# Patient Record
Sex: Male | Born: 1935 | Race: Black or African American | Hispanic: No | Marital: Married | Smoking: Former smoker
Health system: Southern US, Community
[De-identification: ages and names within clinical notes are randomized; demographics above are authoritative.]

## PROBLEM LIST (undated history)

## (undated) DIAGNOSIS — J449 Chronic obstructive pulmonary disease, unspecified: Secondary | ICD-10-CM

## (undated) DIAGNOSIS — K219 Gastro-esophageal reflux disease without esophagitis: Secondary | ICD-10-CM

## (undated) DIAGNOSIS — I10 Essential (primary) hypertension: Secondary | ICD-10-CM

## (undated) DIAGNOSIS — E049 Nontoxic goiter, unspecified: Secondary | ICD-10-CM

## (undated) DIAGNOSIS — N400 Enlarged prostate without lower urinary tract symptoms: Secondary | ICD-10-CM

## (undated) DIAGNOSIS — G4730 Sleep apnea, unspecified: Secondary | ICD-10-CM

## (undated) DIAGNOSIS — H409 Unspecified glaucoma: Secondary | ICD-10-CM

## (undated) HISTORY — PX: THYROIDECTOMY: SHX17

## (undated) HISTORY — DX: Benign prostatic hyperplasia without lower urinary tract symptoms: N40.0

## (undated) HISTORY — DX: Sleep apnea, unspecified: G47.30

## (undated) HISTORY — PX: TONSILLECTOMY: SUR1361

## (undated) HISTORY — PX: KNEE SURGERY: SHX244

## (undated) HISTORY — DX: Gastro-esophageal reflux disease without esophagitis: K21.9

## (undated) HISTORY — PX: FOOT SURGERY: SHX648

## (undated) HISTORY — PX: HERNIA REPAIR: SHX51

## (undated) HISTORY — DX: Essential (primary) hypertension: I10

## (undated) HISTORY — DX: Unspecified glaucoma: H40.9

## (undated) HISTORY — DX: Nontoxic goiter, unspecified: E04.9

## (undated) HISTORY — DX: Chronic obstructive pulmonary disease, unspecified: J44.9

## (undated) HISTORY — PX: PROSTATE SURGERY: SHX751

---

## 2004-06-28 ENCOUNTER — Ambulatory Visit: Payer: Self-pay | Admitting: Cardiology

## 2004-12-18 ENCOUNTER — Ambulatory Visit: Payer: Self-pay | Admitting: Cardiology

## 2004-12-20 ENCOUNTER — Ambulatory Visit: Payer: Self-pay | Admitting: Cardiology

## 2004-12-26 ENCOUNTER — Ambulatory Visit: Payer: Self-pay | Admitting: Physician Assistant

## 2004-12-31 ENCOUNTER — Inpatient Hospital Stay (HOSPITAL_BASED_OUTPATIENT_CLINIC_OR_DEPARTMENT_OTHER): Admission: RE | Admit: 2004-12-31 | Discharge: 2004-12-31 | Payer: Self-pay | Admitting: Internal Medicine

## 2004-12-31 ENCOUNTER — Ambulatory Visit: Payer: Self-pay | Admitting: Internal Medicine

## 2005-01-15 ENCOUNTER — Ambulatory Visit: Payer: Self-pay | Admitting: Cardiology

## 2005-01-18 ENCOUNTER — Ambulatory Visit: Payer: Self-pay | Admitting: *Deleted

## 2005-01-29 ENCOUNTER — Ambulatory Visit: Payer: Self-pay | Admitting: Cardiology

## 2005-03-05 ENCOUNTER — Ambulatory Visit: Payer: Self-pay | Admitting: Cardiology

## 2005-03-18 ENCOUNTER — Ambulatory Visit: Payer: Self-pay | Admitting: Cardiology

## 2005-05-15 ENCOUNTER — Ambulatory Visit: Payer: Self-pay | Admitting: Cardiology

## 2005-09-30 ENCOUNTER — Ambulatory Visit: Payer: Self-pay | Admitting: Cardiology

## 2010-01-12 ENCOUNTER — Ambulatory Visit: Payer: Self-pay | Admitting: Cardiology

## 2010-11-23 NOTE — Cardiovascular Report (Signed)
Trevor Burns, Trevor Burns                ACCOUNT NO.:  [id number]   MEDICAL RECORD NO.:  [id number]          PATIENT TYPE:  OIB   LOCATION:  6501                         FACILITY:  [facility]   PHYSICIAN:  Arvilla Meres, M.D. LHCDATE OF BIRTH:  [birth year]   DATE OF PROCEDURE:  12/31/2004  DATE OF DISCHARGE:                              CARDIAC CATHETERIZATION   PRIMARY CARDIOLOGIST:  Learta Codding, M.D.   PROCEDURES PERFORMED:  1.  Selective coronary angiography.  2.  Left heart catheterization.  3.  Left ventriculogram.  4.  Aortic root angiography.  5.  Abdominal aortogram.   DESCRIPTION OF PROCEDURE:  The risks and benefits of the catheterization  were explained to Trevor Burns.  Consent was signed and placed on the chart.  A 4 French arterial sheath was placed in the right femoral artery using a  modified Seldinger technique.  Standard JL5, JR4 and angled pigtail were  used for the procedure.  There were no apparent complications.  At the end  of the procedure, the patient was transferred to the holding area in stable  condition for removal of his arterial access.   HEMODYNAMICS:  The aortic pressure was 127/86 with a mean of 101.  However,  there was a disconnect as his cuff pressure showed 170/100.  LV pressure was  130/4 with an LVEDP of 11.  There was no significant gradient on aortic  valve pullback.   CORONARY ANATOMY:  The left main was normal.   The LAD was a large vessel wrapping the apex.  It gave off three diagonals.  The first diagonal was large, the second diagonal was tiny and the third  diagonal was medium size.  There was a 40% lesion in the mid LAD at the  takeoff of the second diagonal.  The artery was otherwise free of  significant coronary disease.   The left circumflex was a large codominant vessel.  It gave off two large  OMs and two small PLs.  There as no angiographic coronary disease.   The right coronary artery was a moderate, codominant vessel.  It  ended with  a PDA.  It gave off a large right ventricular branch which also served acute  marginal territory.  There was no angiographic CAD.   Left ventriculogram done in the RAO approach showed moderate LV dysfunction  with an EF estimated about 35-40%.  There appeared to be a global  hypokinesis.   Abdominal aortogram showed that the distal aorta and iliac system were free  of any significant atherosclerotic plaquing.   The aortic root shot showed the ascending aorta to be mildly dilated.  It  did not fill the cusps well, however, there did not appear to be a  significant aortic insufficiency.  The thoracic aorta was unfolded.  There  was no significant atherosclerotic plaquing.   ASSESSMENT:  1.  Minimal nonobstructive coronary disease with 40% mid lesion.  Otherwise      no significant coronary disease.  2.  Moderate left ventricular dysfunction consistent with nonischemic      cardiomyopathy.  Filling pressure appear to  be okay.  3.  Mildly dilated aortic root without obvious aortic insufficiency.  4.  Patent renal arteries with no significant aortoiliac plaquing.  5.  Disconnect between cuff pressures and central aortic pressures.   PLAN:  We will continue medical therapy for his nonischemic cardiomyopathy.  Would consider an echocardiogram to accurately measure the width of his  aortic root and to make sure that there is no significant valvular disease.       DB/MEDQ  D:  12/31/2004  T:  12/31/2004  Job:  [id number]

## 2012-03-06 ENCOUNTER — Encounter: Payer: Self-pay | Admitting: Emergency Medicine

## 2012-03-08 ENCOUNTER — Encounter: Payer: Self-pay | Admitting: Physician Assistant

## 2012-03-08 DIAGNOSIS — J40 Bronchitis, not specified as acute or chronic: Secondary | ICD-10-CM

## 2012-03-08 DIAGNOSIS — R079 Chest pain, unspecified: Secondary | ICD-10-CM

## 2012-03-08 DIAGNOSIS — I4891 Unspecified atrial fibrillation: Secondary | ICD-10-CM

## 2012-03-10 ENCOUNTER — Encounter: Payer: Self-pay | Admitting: Cardiology

## 2012-03-10 ENCOUNTER — Encounter: Payer: Self-pay | Admitting: Internal Medicine

## 2012-03-10 ENCOUNTER — Encounter: Payer: Self-pay | Admitting: Physician Assistant

## 2012-03-10 DIAGNOSIS — R072 Precordial pain: Secondary | ICD-10-CM

## 2012-03-19 ENCOUNTER — Encounter: Payer: Self-pay | Admitting: Cardiology

## 2012-03-20 ENCOUNTER — Ambulatory Visit (INDEPENDENT_AMBULATORY_CARE_PROVIDER_SITE_OTHER): Payer: Medicare Other | Admitting: Physician Assistant

## 2012-03-20 ENCOUNTER — Encounter: Payer: Self-pay | Admitting: *Deleted

## 2012-03-20 ENCOUNTER — Other Ambulatory Visit: Payer: Self-pay | Admitting: Physician Assistant

## 2012-03-20 ENCOUNTER — Encounter: Payer: Self-pay | Admitting: Physician Assistant

## 2012-03-20 VITALS — BP 100/60 | HR 79 | Ht 74.0 in | Wt 178.1 lb

## 2012-03-20 DIAGNOSIS — I4891 Unspecified atrial fibrillation: Secondary | ICD-10-CM

## 2012-03-20 DIAGNOSIS — R609 Edema, unspecified: Secondary | ICD-10-CM

## 2012-03-20 DIAGNOSIS — I499 Cardiac arrhythmia, unspecified: Secondary | ICD-10-CM

## 2012-03-20 DIAGNOSIS — N289 Disorder of kidney and ureter, unspecified: Secondary | ICD-10-CM

## 2012-03-20 DIAGNOSIS — I10 Essential (primary) hypertension: Secondary | ICD-10-CM

## 2012-03-20 DIAGNOSIS — R0989 Other specified symptoms and signs involving the circulatory and respiratory systems: Secondary | ICD-10-CM

## 2012-03-20 NOTE — Progress Notes (Signed)
Primary Cardiologist:  HPI: Post hospital followup from [facility], status post cardiology consultation on September 1, by cardiology fellow, for evaluation of CP and new onset AF.  Patient ruled out for MI with normal cardiac markers. TSH normal. Of note, however, there was no documented atrial fibrillation, during his brief stay. Nevertheless, he was assessed with a CHADS2 score of 2.  Also, clinically patient denies any history of palpitations. He states that the reason he was sent to [facility] was per Dr. Ruthe Mannan request. He presented to his office with complaint of SOB, but denied any chest pain.  Since discharge, he continues to report no angina pectoris or palpitations. In fact, he denies any prior history of exertional CP. He has no known CAD, with history of prior negative stress test. When he does complain of, however, he has worsening DOE. And he denies any associated orthopnea or PND. He does have COPD, for which he is on nebulizer therapy and uses oxygen as needed. He states he quit smoking over 40 years ago.  EKG office today, reviewed by me, indicated NSR at 79 bpm, with no ischemic changes.  Allergies  Allergen Reactions  . Oxycodone     Current Outpatient Prescriptions  Medication Sig Dispense Refill  . albuterol (PROVENTIL HFA;VENTOLIN HFA) 108 (90 BASE) MCG/ACT inhaler Inhale 1 puff into the lungs every 6 (six) hours as needed.      Marland Kitchen albuterol (PROVENTIL) (2.5 MG/3ML) 0.083% nebulizer solution Take 2.5 mg by nebulization every 6 (six) hours as needed.      Marland Kitchen albuterol-ipratropium (COMBIVENT) 18-103 MCG/ACT inhaler Inhale 1 puff into the lungs every 6 (six) hours as needed.       Marland Kitchen arformoterol (BROVANA) 15 MCG/2ML NEBU Take 15 mcg by nebulization 2 (two) times daily.      Marland Kitchen aspirin 81 MG tablet Take 81 mg by mouth daily.      . Azilsartan Medoxomil 80 MG TABS Take 80 mg by mouth daily.      . bimatoprost (LUMIGAN) 0.03 % ophthalmic solution 1 drop at bedtime.      . brimonidine  (ALPHAGAN) 0.15 % ophthalmic solution 1 drop 3 (three) times daily.      . budesonide (PULMICORT) 180 MCG/ACT inhaler Inhale 1 puff into the lungs 2 (two) times daily.      Marland Kitchen CALCIUM PO Take 500 mg by mouth daily.      . cetirizine (ZYRTEC) 10 MG tablet Take 10 mg by mouth daily.      . cholecalciferol (VITAMIN D) 1000 UNITS tablet Take 1,000 Units by mouth daily.      Marland Kitchen doxazosin (CARDURA) 2 MG tablet Take 2 mg by mouth at bedtime.      Marland Kitchen EPINEPHrine (EPIPEN JR) 0.15 MG/0.3ML injection Inject 0.15 mg into the muscle as needed.      Marland Kitchen esomeprazole (NEXIUM) 20 MG capsule Take 20 mg by mouth daily before breakfast.      . furosemide (LASIX) 20 MG tablet Take 20 mg by mouth daily.      Marland Kitchen guaiFENesin (MUCINEX) 600 MG 12 hr tablet Take 1,200 mg by mouth 2 (two) times daily.      Marland Kitchen levothyroxine (SYNTHROID, LEVOTHROID) 125 MCG tablet Take 125 mcg by mouth daily.      Marland Kitchen POTASSIUM CHLORIDE PO Take 500 mg by mouth daily.      Marland Kitchen pyridoxine (B-6) 250 MG tablet Take 250 mg by mouth daily.      . theophylline (THEO-24) 100 MG 24 hr  capsule Take 100 mg by mouth daily.        Past Medical History  Diagnosis Date  . Hypertension   . Acid reflux   . Glaucoma   . Sleep apnea   . Prostatic hypertrophy   . GERD (gastroesophageal reflux disease)   . Chronic obstructive pulmonary disease   . Thyroid enlargement     Had a throidectomy    Past Surgical History  Procedure Date  . Foot surgery   . Knee surgery   . Hernia repair   . Tonsillectomy   . Prostate surgery   . Thyroidectomy     History   Social History  . Marital Status: Married    Spouse Name: N/A    Number of Children: N/A  . Years of Education: N/A   Occupational History  . Not on file.   Social History Main Topics  . Smoking status: Former Smoker -- 0.3 packs/day for 60 years    Types: Cigarettes    Quit date: 07/09/2007  . Smokeless tobacco: Not on file  . Alcohol Use: No  . Drug Use: No  . Sexually Active: Not on file     Other Topics Concern  . Not on file   Social History Narrative  . No narrative on file    Family History  Problem Relation Age of Onset  . Heart disease Mother   . Alcohol abuse Father   . Cancer Father     Cause of death; he also had a colostomy bag    ROS: no nausea, vomiting; no fever, chills; no melena, hematochezia; no claudication  PHYSICAL EXAM: BP 100/60  Pulse 79  Ht 6\' 2"  (1.88 m)  Wt 178 lb 1.9 oz (80.795 kg)  BMI 22.87 kg/m2 GENERAL: 78 year-old male; NAD HEENT: NCAT, PERRLA, EOMI; sclera clear; no xanthelasma NECK: palpable bilateral carotid pulses, no bruits; no JVD; no TM LUNGS: Faint expiratory wheezes, no crackles CARDIAC: RRR (S1, S2); no significant murmurs; no rubs or gallops ABDOMEN: soft, non-tender; intact BS EXTREMETIES: 2+ bilateral nonpitting peripheral edema SKIN: warm/dry; no obvious rash/lesions MUSCULOSKELETAL: no joint deformity NEURO: no focal deficit; NL affect   EKG: reviewed and available in Electronic Records   ASSESSMENT & PLAN:  Dyspnea on exertion I am concerned that this may represent an anginal equivalent. Therefore, will schedule a Lexiscan stress Myoview for risk stratification, to rule out ischemia. If this is abnormal, then we'll need to strongly consider proceeding with coronary angiography to rule out significant CAD. Will schedule early return followup with myself in 2 weeks, for review of test results and further recommendations. Of note, the patient's wife states that he had previously followed with Dr. Diona Browner, here in our [place] clinic.  Edema Recent echocardiogram indicated normal RV function. Suspect that this is secondary to chronic venous insufficiency.  Dysrhythmia Although recent cardiology consultation was regarding new onset atrial fibrillation, there was no definite documentation of such. Moreover, patient reports no history of palpitations, and is in NSR at present time. Therefore, no further workup  currently indicated.  Renal insufficiency Patient had a peak creatinine of 1.7 during recent stay, improved to 1.1 at time of discharge. Would need to closely monitor this, in the event he were to proceed with a cardiac catheterization.    Gene Roneka Gilpin, PAC

## 2012-03-20 NOTE — Patient Instructions (Addendum)
Your physician recommends that you schedule a follow-up appointment in: 2 weeks.  Your physician recommends that you continue on your current medications as directed. Please refer to the Current Medication list given to you today.  Your physician has requested that you have a lexiscan myoview. For further information please visit [email or web address] Please follow instruction sheet, as given.

## 2012-03-20 NOTE — Assessment & Plan Note (Signed)
Recent echocardiogram indicated normal RV function. Suspect that this is secondary to chronic venous insufficiency.

## 2012-03-20 NOTE — Assessment & Plan Note (Signed)
Patient had a peak creatinine of 1.7 during recent stay, improved to 1.1 at time of discharge. Would need to closely monitor this, in the event he were to proceed with a cardiac catheterization.

## 2012-03-20 NOTE — Assessment & Plan Note (Signed)
Although recent cardiology consultation was regarding new onset atrial fibrillation, there was no definite documentation of such. Moreover, patient reports no history of palpitations, and is in NSR at present time. Therefore, no further workup currently indicated.

## 2012-03-20 NOTE — Assessment & Plan Note (Signed)
I am concerned that this may represent an anginal equivalent. Therefore, will schedule a Lexiscan stress Myoview for risk stratification, to rule out ischemia. If this is abnormal, then we'll need to strongly consider proceeding with coronary angiography to rule out significant CAD. Will schedule early return followup with myself in 2 weeks, for review of test results and further recommendations. Of note, the patient's wife states that he had previously followed with Dr. Diona Browner, here in our [place] clinic.

## 2012-03-25 DIAGNOSIS — I4891 Unspecified atrial fibrillation: Secondary | ICD-10-CM

## 2012-03-27 ENCOUNTER — Telehealth: Payer: Self-pay | Admitting: *Deleted

## 2012-03-27 NOTE — Telephone Encounter (Signed)
Patient informed. 

## 2012-03-27 NOTE — Telephone Encounter (Signed)
Message copied by Eustace Moore on Fri Mar 27, 2012  4:15 PM ------      Message from: Rande Brunt      Created: Thu Mar 26, 2012  3:50 PM       Abnormal stress test suggestive of ischemia. Need to discuss possible cardiac catheterization, at early office followup.

## 2012-04-01 ENCOUNTER — Ambulatory Visit: Payer: Medicare Other | Admitting: Physician Assistant

## 2012-04-02 ENCOUNTER — Ambulatory Visit: Payer: Medicare Other | Admitting: Physician Assistant

## 2012-04-03 ENCOUNTER — Ambulatory Visit (INDEPENDENT_AMBULATORY_CARE_PROVIDER_SITE_OTHER): Payer: Medicare Other | Admitting: Physician Assistant

## 2012-04-03 ENCOUNTER — Encounter: Payer: Self-pay | Admitting: *Deleted

## 2012-04-03 ENCOUNTER — Encounter: Payer: Self-pay | Admitting: Physician Assistant

## 2012-04-03 VITALS — BP 135/82 | HR 50 | Ht 74.0 in | Wt 178.0 lb

## 2012-04-03 DIAGNOSIS — I10 Essential (primary) hypertension: Secondary | ICD-10-CM

## 2012-04-03 DIAGNOSIS — R9430 Abnormal result of cardiovascular function study, unspecified: Secondary | ICD-10-CM | POA: Insufficient documentation

## 2012-04-03 DIAGNOSIS — Z01810 Encounter for preprocedural cardiovascular examination: Secondary | ICD-10-CM

## 2012-04-03 DIAGNOSIS — N289 Disorder of kidney and ureter, unspecified: Secondary | ICD-10-CM

## 2012-04-03 DIAGNOSIS — R072 Precordial pain: Secondary | ICD-10-CM

## 2012-04-03 DIAGNOSIS — R0602 Shortness of breath: Secondary | ICD-10-CM

## 2012-04-03 DIAGNOSIS — R609 Edema, unspecified: Secondary | ICD-10-CM

## 2012-04-03 LAB — PROTIME-INR

## 2012-04-03 NOTE — Assessment & Plan Note (Signed)
Stable on current medication regimen 

## 2012-04-03 NOTE — Assessment & Plan Note (Addendum)
Cardiac catheterization to be performed with limited dye load, and without LVG. Will check followup labs. Patient had a peak creatinine of 1.7 on September 1, with subsequent normalization of renal function on September 4. Lasix to be discontinued, in preparation for the procedure.

## 2012-04-03 NOTE — Assessment & Plan Note (Signed)
Recent echocardiogram indicated normal RV function. Suspect that this is secondary to chronic venous insufficiency. 

## 2012-04-03 NOTE — Assessment & Plan Note (Signed)
Recommendation is to proceed with an elective diagnostic cardiac catheterization to exclude significant CAD. I am concerned that the patient's complaint of DOE represents an anginal equivalent. Results of the recent abnormal Myoview study were reviewed with the patient and his wife, and they both agreed to proceed. The risks/benefits of a cardiac catheterization were  reviewed. The plan was also reviewed with, and agreed by, Dr. Myrtis Ser. Of note, we will arrange for this to be a right/left heart procedure, in light of his diagnosis of COPD and his requirement for continuous oxygen. We will also limit contrast dye, and deferred LVG secondary to recent history of renal sufficiency. Moreover, a recent 2D echocardiogram yielded normal LVF.

## 2012-04-03 NOTE — Patient Instructions (Addendum)
   Left & right heart catherization - see info sheet given   Stop Lasix   Stop Potassium  Continue all other current medications. Follow up given after above procedure

## 2012-04-03 NOTE — Progress Notes (Signed)
Primary Cardiologist: Simona Huh, MD   HPI: Patient presents for review of recent abnormal stress test.  When last seen here in clinic for post hospital followup on September 13, I was concerned that his DOE represented an anginal equivalent. He presented with no known history of CAD, and had recently ruled out for MI with negative cardiac markers, here at [facility]. He also had an echocardiogram which yielded normal LVF (EF 55-60%), with diastolic dysfunction, normal RVF, and no significant valvular abnormalities.   We proceeded with a Lexiscan Cardiolite on September 18, reviewed by Dr. Myrtis Ser, which suggested anteroseptal/anterior wall ischemia.  Since his last OV, patient reports no exacerbation of his baseline symptoms of DOE. However, he now suggests some lower neck tightness associated with walking, relieved by rest. He continues to deny any frank CP.    Allergies  Allergen Reactions  . Oxycodone     Current Outpatient Prescriptions  Medication Sig Dispense Refill  . albuterol (PROVENTIL HFA;VENTOLIN HFA) 108 (90 BASE) MCG/ACT inhaler Inhale 1 puff into the lungs every 6 (six) hours as needed.      Marland Kitchen albuterol (PROVENTIL) (2.5 MG/3ML) 0.083% nebulizer solution Take 2.5 mg by nebulization every 6 (six) hours as needed.      Marland Kitchen albuterol-ipratropium (COMBIVENT) 18-103 MCG/ACT inhaler Inhale 1 puff into the lungs every 6 (six) hours as needed.       Marland Kitchen arformoterol (BROVANA) 15 MCG/2ML NEBU Take 15 mcg by nebulization 2 (two) times daily.      Marland Kitchen aspirin 81 MG tablet Take 81 mg by mouth daily.      . Azilsartan Medoxomil 80 MG TABS Take 80 mg by mouth daily.      . bimatoprost (LUMIGAN) 0.03 % ophthalmic solution 1 drop at bedtime.      . brimonidine (ALPHAGAN) 0.15 % ophthalmic solution 1 drop 2 (two) times daily.       . budesonide (PULMICORT) 180 MCG/ACT inhaler Inhale 1 puff into the lungs 2 (two) times daily.      Marland Kitchen CALCIUM PO Take 500 mg by mouth daily.      . cetirizine (ZYRTEC) 10 MG  tablet Take 10 mg by mouth daily.      . cholecalciferol (VITAMIN D) 1000 UNITS tablet Take 1,000 Units by mouth daily.      Marland Kitchen doxazosin (CARDURA) 2 MG tablet Take 2 mg by mouth at bedtime.      . DULERA 200-5 MCG/ACT AERO Inhale 2 puffs into the lungs 2 (two) times daily.       Marland Kitchen EPINEPHrine (EPIPEN JR) 0.15 MG/0.3ML injection Inject 0.15 mg into the muscle as needed.      Marland Kitchen esomeprazole (NEXIUM) 20 MG capsule Take 20 mg by mouth daily before breakfast.      . furosemide (LASIX) 20 MG tablet Take 20 mg by mouth daily.      Marland Kitchen guaiFENesin (MUCINEX) 600 MG 12 hr tablet Take 1,200 mg by mouth as needed.       Marland Kitchen levothyroxine (SYNTHROID, LEVOTHROID) 125 MCG tablet Take 125 mcg by mouth daily.      Marland Kitchen POTASSIUM CHLORIDE PO Take 500 mg by mouth daily.      Marland Kitchen pyridoxine (B-6) 250 MG tablet Take 250 mg by mouth daily.      . theophylline (THEO-24) 100 MG 24 hr capsule Take 100 mg by mouth daily.        Past Medical History  Diagnosis Date  . Hypertension   . Acid reflux   .  Glaucoma   . Sleep apnea   . Prostatic hypertrophy   . GERD (gastroesophageal reflux disease)   . Chronic obstructive pulmonary disease   . Thyroid enlargement     Had a throidectomy    Past Surgical History  Procedure Date  . Foot surgery   . Knee surgery   . Hernia repair   . Tonsillectomy   . Prostate surgery   . Thyroidectomy     History   Social History  . Marital Status: Married    Spouse Name: N/A    Number of Children: N/A  . Years of Education: N/A   Occupational History  . Not on file.   Social History Main Topics  . Smoking status: Former Smoker -- 0.3 packs/day for 60 years    Types: Cigarettes    Quit date: 07/09/2007  . Smokeless tobacco: Not on file  . Alcohol Use: No  . Drug Use: No  . Sexually Active: Not on file   Other Topics Concern  . Not on file   Social History Narrative  . No narrative on file   Social History Narrative  . No narrative on file    Problem Relation Age  of Onset  . Heart disease Mother   . Alcohol abuse Father   . Cancer Father     Cause of death; he also had a colostomy bag    ROS: no nausea, vomiting; no fever, chills; no melena, hematochezia; no claudication  PHYSICAL EXAM: BP 135/82  Pulse 50  Ht 6\' 2"  (1.88 m)  Wt 178 lb (80.74 kg)  BMI 22.85 kg/m2  SpO2 97% GENERAL: 78 year-old male, sitting in wheelchair; NAD  HEENT: NCAT, PERRLA, EOMI; sclera clear; no xanthelasma; nasal cannula  NECK: palpable bilateral carotid pulses, no bruits; no JVD; no TM  LUNGS: Faint expiratory wheezes, no crackles  CARDIAC: RRR (S1, S2); no significant murmurs; no rubs or gallops  ABDOMEN: soft, non-tender; intact BS  EXTREMETIES: 2-3+ bilateral nonpitting peripheral edema  SKIN: warm/dry; no obvious rash/lesions  MUSCULOSKELETAL: no joint deformity  NEURO: no focal deficit; NL affect    EKG:    ASSESSMENT & PLAN:  Nonspecific abnormal unspecified cardiovascular function study Recommendation is to proceed with an elective diagnostic cardiac catheterization to exclude significant CAD. I am concerned that the patient's complaint of DOE represents an anginal equivalent. Results of the recent abnormal Myoview study were reviewed with the patient and his wife, and they both agreed to proceed. The risks/benefits of a cardiac catheterization were  reviewed. The plan was also reviewed with, and agreed by, Dr. Myrtis Ser. Of note, we will arrange for this to be a right/left heart procedure, in light of his diagnosis of COPD and his requirement for continuous oxygen. We will also limit contrast dye, and deferred LVG secondary to recent history of renal sufficiency. Moreover, a recent 2D echocardiogram yielded normal LVF.  Renal insufficiency Cardiac catheterization to be performed with limited dye load, and without LVG. Will check followup labs. Patient had a peak creatinine of 1.7 on September 1, with subsequent normalization of renal function on September 4.  Lasix to be discontinued, in preparation for the procedure.  Hypertension Stable on current medication regimen  Edema Recent echocardiogram indicated normal RV function. Suspect that this is secondary to chronic venous insufficiency.        Gene Nonie Lochner, PAC

## 2012-04-06 ENCOUNTER — Other Ambulatory Visit: Payer: Self-pay | Admitting: Physician Assistant

## 2012-04-06 DIAGNOSIS — R9430 Abnormal result of cardiovascular function study, unspecified: Secondary | ICD-10-CM

## 2012-04-08 ENCOUNTER — Inpatient Hospital Stay (HOSPITAL_BASED_OUTPATIENT_CLINIC_OR_DEPARTMENT_OTHER)
Admission: RE | Admit: 2012-04-08 | Discharge: 2012-04-08 | Disposition: A | Discharge status: Home / Self Care | Payer: Medicare Other | Source: Ambulatory Visit | Attending: Cardiovascular Disease | Admitting: Cardiovascular Disease

## 2012-04-08 ENCOUNTER — Encounter (HOSPITAL_BASED_OUTPATIENT_CLINIC_OR_DEPARTMENT_OTHER)
Admission: RE | Disposition: A | Discharge status: Home / Self Care | Payer: Self-pay | Source: Ambulatory Visit | Attending: Cardiovascular Disease

## 2012-04-08 DIAGNOSIS — R0609 Other forms of dyspnea: Secondary | ICD-10-CM

## 2012-04-08 DIAGNOSIS — R9430 Abnormal result of cardiovascular function study, unspecified: Secondary | ICD-10-CM

## 2012-04-08 DIAGNOSIS — J4489 Other specified chronic obstructive pulmonary disease: Secondary | ICD-10-CM | POA: Insufficient documentation

## 2012-04-08 DIAGNOSIS — M542 Cervicalgia: Secondary | ICD-10-CM | POA: Insufficient documentation

## 2012-04-08 DIAGNOSIS — R9439 Abnormal result of other cardiovascular function study: Secondary | ICD-10-CM | POA: Insufficient documentation

## 2012-04-08 DIAGNOSIS — I10 Essential (primary) hypertension: Secondary | ICD-10-CM | POA: Insufficient documentation

## 2012-04-08 DIAGNOSIS — J449 Chronic obstructive pulmonary disease, unspecified: Secondary | ICD-10-CM | POA: Insufficient documentation

## 2012-04-08 DIAGNOSIS — R0989 Other specified symptoms and signs involving the circulatory and respiratory systems: Secondary | ICD-10-CM | POA: Insufficient documentation

## 2012-04-08 LAB — POCT I-STAT 3, ART BLOOD GAS (G3+)
O2 Saturation: 95 %
TCO2: 26 mmol/L (ref 0–100)
pCO2 arterial: 37.8 mmHg (ref 35.0–45.0)
pH, Arterial: 7.427 (ref 7.350–7.450)
pO2, Arterial: 74 mmHg — ABNORMAL LOW (ref 80.0–100.0)

## 2012-04-08 LAB — POCT I-STAT 3, VENOUS BLOOD GAS (G3P V)
Bicarbonate: 26.9 mEq/L — ABNORMAL HIGH (ref 20.0–24.0)
pH, Ven: 7.38 — ABNORMAL HIGH (ref 7.250–7.300)
pO2, Ven: 33 mmHg (ref 30.0–45.0)

## 2012-04-08 SURGERY — JV LEFT AND RIGHT HEART CATHETERIZATION WITH CORONARY ANGIOGRAM
Anesthesia: Moderate Sedation

## 2012-04-08 MED ORDER — SODIUM CHLORIDE 0.9 % IJ SOLN: 3.0000 mL | Freq: Two times a day (BID) | INTRAMUSCULAR | Status: DC

## 2012-04-08 MED ORDER — SODIUM CHLORIDE 0.9 % IV SOLN: 250.0000 mL | INTRAVENOUS | Status: DC | PRN

## 2012-04-08 MED ORDER — ALBUTEROL SULFATE (5 MG/ML) 0.5% IN NEBU
2.5000 mg | INHALATION_SOLUTION | Freq: Once | RESPIRATORY_TRACT | Status: AC
  Administered 2012-04-08: 2.5 mg via RESPIRATORY_TRACT
  Filled 2012-04-08: qty 0.5

## 2012-04-08 MED ORDER — SODIUM CHLORIDE 0.9 % IV SOLN: INTRAVENOUS | Status: DC

## 2012-04-08 MED ORDER — ALBUTEROL SULFATE 2 MG/5ML PO SYRP: 2.0000 mg | ORAL_SOLUTION | Freq: Once | ORAL | Status: DC

## 2012-04-08 MED ORDER — HYDRALAZINE HCL 20 MG/ML IJ SOLN
10.0000 mg | Freq: Once | INTRAMUSCULAR | Status: AC
  Administered 2012-04-08: 10 mg via INTRAVENOUS

## 2012-04-08 MED ORDER — ACETAMINOPHEN 325 MG PO TABS: 650.0000 mg | ORAL_TABLET | ORAL | Status: DC | PRN

## 2012-04-08 MED ORDER — SODIUM CHLORIDE 0.9 % IJ SOLN: 3.0000 mL | INTRAMUSCULAR | Status: DC | PRN

## 2012-04-08 MED ORDER — ASPIRIN 81 MG PO CHEW
324.0000 mg | CHEWABLE_TABLET | ORAL | Status: AC
  Administered 2012-04-08: 243 mg via ORAL

## 2012-04-08 MED ORDER — SODIUM CHLORIDE 0.9 % IV SOLN: INTRAVENOUS | Status: AC

## 2012-04-08 NOTE — OR Nursing (Signed)
During discharge pt became very dyspneic with resp rate 32/ min and work of breathing increased, 02 sat remained in mid 90s, Dr Clifton James notified and returned to see pt, he ordered Albuterol breathing tx which relieved dyspnea within a 10-15 min period. Pt breathing normally at time of discharge

## 2012-04-08 NOTE — H&P (View-Only) (Signed)
Primary Cardiologist: Sam McDowell, MD   HPI: Patient presents for review of recent abnormal stress test.  When last seen here in clinic for post hospital followup on September 13, I was concerned that his DOE represented an anginal equivalent. He presented with no known history of CAD, and had recently ruled out for MI with negative cardiac markers, here at [facility]. He also had an echocardiogram which yielded normal LVF (EF 55-60%), with diastolic dysfunction, normal RVF, and no significant valvular abnormalities.   We proceeded with a Lexiscan Cardiolite on September 18, reviewed by Dr. Katz, which suggested anteroseptal/anterior wall ischemia.  Since his last OV, patient reports no exacerbation of his baseline symptoms of DOE. However, he now suggests some lower neck tightness associated with walking, relieved by rest. He continues to deny any frank CP.    Allergies  Allergen Reactions  . Oxycodone     Current Outpatient Prescriptions  Medication Sig Dispense Refill  . albuterol (PROVENTIL HFA;VENTOLIN HFA) 108 (90 BASE) MCG/ACT inhaler Inhale 1 puff into the lungs every 6 (six) hours as needed.      . albuterol (PROVENTIL) (2.5 MG/3ML) 0.083% nebulizer solution Take 2.5 mg by nebulization every 6 (six) hours as needed.      . albuterol-ipratropium (COMBIVENT) 18-103 MCG/ACT inhaler Inhale 1 puff into the lungs every 6 (six) hours as needed.       . arformoterol (BROVANA) 15 MCG/2ML NEBU Take 15 mcg by nebulization 2 (two) times daily.      . aspirin 81 MG tablet Take 81 mg by mouth daily.      . Azilsartan Medoxomil 80 MG TABS Take 80 mg by mouth daily.      . bimatoprost (LUMIGAN) 0.03 % ophthalmic solution 1 drop at bedtime.      . brimonidine (ALPHAGAN) 0.15 % ophthalmic solution 1 drop 2 (two) times daily.       . budesonide (PULMICORT) 180 MCG/ACT inhaler Inhale 1 puff into the lungs 2 (two) times daily.      . CALCIUM PO Take 500 mg by mouth daily.      . cetirizine (ZYRTEC) 10 MG  tablet Take 10 mg by mouth daily.      . cholecalciferol (VITAMIN D) 1000 UNITS tablet Take 1,000 Units by mouth daily.      . doxazosin (CARDURA) 2 MG tablet Take 2 mg by mouth at bedtime.      . DULERA 200-5 MCG/ACT AERO Inhale 2 puffs into the lungs 2 (two) times daily.       . EPINEPHrine (EPIPEN JR) 0.15 MG/0.3ML injection Inject 0.15 mg into the muscle as needed.      . esomeprazole (NEXIUM) 20 MG capsule Take 20 mg by mouth daily before breakfast.      . furosemide (LASIX) 20 MG tablet Take 20 mg by mouth daily.      . guaiFENesin (MUCINEX) 600 MG 12 hr tablet Take 1,200 mg by mouth as needed.       . levothyroxine (SYNTHROID, LEVOTHROID) 125 MCG tablet Take 125 mcg by mouth daily.      . POTASSIUM CHLORIDE PO Take 500 mg by mouth daily.      . pyridoxine (B-6) 250 MG tablet Take 250 mg by mouth daily.      . theophylline (THEO-24) 100 MG 24 hr capsule Take 100 mg by mouth daily.        Past Medical History  Diagnosis Date  . Hypertension   . Acid reflux   .   Glaucoma   . Sleep apnea   . Prostatic hypertrophy   . GERD (gastroesophageal reflux disease)   . Chronic obstructive pulmonary disease   . Thyroid enlargement     Had a throidectomy    Past Surgical History  Procedure Date  . Foot surgery   . Knee surgery   . Hernia repair   . Tonsillectomy   . Prostate surgery   . Thyroidectomy     History   Social History  . Marital Status: Married    Spouse Name: N/A    Number of Children: N/A  . Years of Education: N/A   Occupational History  . Not on file.   Social History Main Topics  . Smoking status: Former Smoker -- 0.3 packs/day for 60 years    Types: Cigarettes    Quit date: 07/09/2007  . Smokeless tobacco: Not on file  . Alcohol Use: No  . Drug Use: No  . Sexually Active: Not on file   Other Topics Concern  . Not on file   Social History Narrative  . No narrative on file   Social History Narrative  . No narrative on file    Problem Relation Age  of Onset  . Heart disease Mother   . Alcohol abuse Father   . Cancer Father     Cause of death; he also had a colostomy bag    ROS: no nausea, vomiting; no fever, chills; no melena, hematochezia; no claudication  PHYSICAL EXAM: BP 135/82  Pulse 50  Ht 6' 2" (1.88 m)  Wt 178 lb (80.74 kg)  BMI 22.85 kg/m2  SpO2 97% GENERAL: 78 year-old male, sitting in wheelchair; NAD  HEENT: NCAT, PERRLA, EOMI; sclera clear; no xanthelasma; nasal cannula  NECK: palpable bilateral carotid pulses, no bruits; no JVD; no TM  LUNGS: Faint expiratory wheezes, no crackles  CARDIAC: RRR (S1, S2); no significant murmurs; no rubs or gallops  ABDOMEN: soft, non-tender; intact BS  EXTREMETIES: 2-3+ bilateral nonpitting peripheral edema  SKIN: warm/dry; no obvious rash/lesions  MUSCULOSKELETAL: no joint deformity  NEURO: no focal deficit; NL affect    EKG:    ASSESSMENT & PLAN:  Nonspecific abnormal unspecified cardiovascular function study Recommendation is to proceed with an elective diagnostic cardiac catheterization to exclude significant CAD. I am concerned that the patient's complaint of DOE represents an anginal equivalent. Results of the recent abnormal Myoview study were reviewed with the patient and his wife, and they both agreed to proceed. The risks/benefits of a cardiac catheterization were  reviewed. The plan was also reviewed with, and agreed by, Dr. Katz. Of note, we will arrange for this to be a right/left heart procedure, in light of his diagnosis of COPD and his requirement for continuous oxygen. We will also limit contrast dye, and deferred LVG secondary to recent history of renal sufficiency. Moreover, a recent 2D echocardiogram yielded normal LVF.  Renal insufficiency Cardiac catheterization to be performed with limited dye load, and without LVG. Will check followup labs. Patient had a peak creatinine of 1.7 on September 1, with subsequent normalization of renal function on September 4.  Lasix to be discontinued, in preparation for the procedure.  Hypertension Stable on current medication regimen  Edema Recent echocardiogram indicated normal RV function. Suspect that this is secondary to chronic venous insufficiency.        Gene Lassie Demorest, PAC  

## 2012-04-08 NOTE — OR Nursing (Signed)
Dr McAlhany at bedside to discuss results and treatment plan with pt and family 

## 2012-04-08 NOTE — OR Nursing (Signed)
Meal served 

## 2012-04-08 NOTE — OR Nursing (Signed)
Tegaderm dressing applied, site level 0, bedrest begins at 1405

## 2012-04-08 NOTE — Interval H&P Note (Signed)
History and Physical Interval Note:  04/08/2012 12:16 PM  Trevor Burns  has presented today for cardiac cath with the diagnosis of SOB  The various methods of treatment have been discussed with the patient and family. After consideration of risks, benefits and other options for treatment, the patient has consented to  Procedure(s) (LRB) with comments: JV LEFT AND RIGHT HEART CATHETERIZATION WITH CORONARY ANGIOGRAM (N/A) as a surgical intervention .  The patient's history has been reviewed, patient examined, no change in status, stable for surgery.  I have reviewed the patient's chart and labs.  Questions were answered to the patient's satisfaction.     Eliya Bubar

## 2012-04-08 NOTE — CV Procedure (Signed)
    Cardiac Catheterization Operative Report  TRISDEN PIPPIN [id number] 10/2/20131:44 PM Ardyth Man, MD  Procedure Performed:  1. Left Heart Catheterization 2. Selective Coronary Angiography 3. Right Heart Catheterization  Operator: Verne Carrow, MD  Indication:   Dyspnea, neck pain worrisome for angina. Stress myoview Eugenie Birks) on September 18 suggested anteroseptal/anterior wall ischemia.                      Procedure Details: The risks, benefits, complications, treatment options, and expected outcomes were discussed with the patient. The patient and/or family concurred with the proposed plan, giving informed consent. The patient was brought to the cath lab after IV hydration was begun and oral premedication was given. The patient was further sedated with Versed.  The right groin was prepped and draped in the usual manner. Using the modified Seldinger access technique, a 4 French sheath was placed in the right femoral artery. A 6 French sheath was inserted into the right femoral vein. A multipurpose catheter was used to perform a right heart catheterization. Standard diagnostic catheters were used to perform selective coronary angiography. A pigtail catheter was used to measure LV pressures. There were no immediate complications. The patient was taken to the recovery area in stable condition.   Hemodynamic Findings: Ao:   143/80            LV:  144/16/25 RA:  11              RV: 36/12/15 PA:  41/18 (mean 28)       PCWP:  18 Fick Cardiac Output: 5.1 L/min Fick Cardiac Index: 2.5 L/min/m2 Central Aortic Saturation: 95% Pulmonary Artery Saturation: 62%   Angiographic Findings:  Left main: No obstructive disease.   Left Anterior Descending Artery: Large vessel that courses to the apex. Moderate sized diagonal branch. No obstructive disease noted.   Circumflex Artery: Large caliber vessel with moderate sized first obtuse marginal branch and small to moderate sized  second obtuse marginal branch. There is no obstructive disease noted.   Right Coronary Artery: Large, dominant vessel with no obstructive disease noted.   Left Ventricular Angiogram: Deferred.  Impression: 1. No angiographic evidence of CAD 2. Mild elevation pulmonary artery pressures 3. Mild elevation pulmonary capillary wedge pressure.   Recommendations: Would resume Lasix today given elevated wedge pressure and normalization of renal function. No further ischemic workup.        Complications:  None; patient tolerated the procedure well.

## 2012-04-08 NOTE — OR Nursing (Signed)
Discharge instructions reviewed and signed, pt stated understanding, ambulated in hall without difficulty, site level 0, transported to nephew's car via wheelchair 

## 2012-04-23 ENCOUNTER — Encounter: Payer: Medicare Other | Admitting: Physician Assistant

## 2012-05-12 ENCOUNTER — Encounter: Payer: Medicare Other | Admitting: Cardiology

## 2012-05-20 ENCOUNTER — Encounter: Payer: Medicare Other | Admitting: Physician Assistant

## 2012-05-21 ENCOUNTER — Ambulatory Visit (INDEPENDENT_AMBULATORY_CARE_PROVIDER_SITE_OTHER): Payer: Medicare Other | Admitting: Physician Assistant

## 2012-05-21 ENCOUNTER — Encounter: Payer: Self-pay | Admitting: Physician Assistant

## 2012-05-21 VITALS — BP 128/82 | HR 91 | Ht 74.0 in | Wt 175.0 lb

## 2012-05-21 DIAGNOSIS — R0602 Shortness of breath: Secondary | ICD-10-CM

## 2012-05-21 DIAGNOSIS — N289 Disorder of kidney and ureter, unspecified: Secondary | ICD-10-CM

## 2012-05-21 DIAGNOSIS — I10 Essential (primary) hypertension: Secondary | ICD-10-CM

## 2012-05-21 DIAGNOSIS — R609 Edema, unspecified: Secondary | ICD-10-CM

## 2012-05-21 DIAGNOSIS — I5032 Chronic diastolic (congestive) heart failure: Secondary | ICD-10-CM

## 2012-05-21 MED ORDER — FUROSEMIDE 20 MG PO TABS: 20.0000 mg | ORAL_TABLET | Freq: Every day | ORAL | Status: AC

## 2012-05-21 NOTE — Patient Instructions (Addendum)
   No added salt  Weigh daily  Labs:  BMET, BNP  Office will contact with results Continue all current medications. Your physician wants you to follow up in: 6 months.  You will receive a reminder letter in the mail one-two months in advance.  If you don't receive a letter, please call our office to schedule the follow up appointment

## 2012-05-21 NOTE — Assessment & Plan Note (Signed)
Much improved. Continue current diuretic regimen.

## 2012-05-21 NOTE — Assessment & Plan Note (Signed)
Will check followup labs 

## 2012-05-21 NOTE — Assessment & Plan Note (Addendum)
Results of the cardiac catheterization were reviewed with the patient and his wife, and he was reassured that he has normal coronary arteries. Given the noted elevated wedge pressure, however, I recommended continuing current dose Lasix, indefinitely. Patient also was advised to weigh himself daily, if possible, and to refrain from added salt in his diet. We will check followup BMET/BNP level today and reassess his clinical status in 6 months, with Dr. Diona Browner, with whom he will establish.

## 2012-05-21 NOTE — Assessment & Plan Note (Addendum)
Stable on current medication regimen, followed by primary M.D. 

## 2012-05-21 NOTE — Progress Notes (Signed)
Primary Cardiologist: Simona Huh, MD   HPI: Patient returns following elective right/left cardiac catheterization, October 2, for ongoing evaluation of DOE, in setting of an abnormal Myoview suggestive of anteroseptal/anterolateral wall ischemia. Patient presented with no known history of CAD. Recent 2-D echo: EF 55-60%, with diastolic dysfunction.   - Cardiac catheterization: No angiographic evidence of CAD; mildly elevated PAP/PCWP. LVG deferred secondary to CKD.  Clinically, patient reports no significant change from his baseline level of exercise tolerance. He denies PND, orthopnea, or significant peripheral edema, but does have occasional DOE. He has resumed Lasix post procedure, and has diuresed 3 pounds since last OV. He has had subsequent followup with Dr. Willaim Bane, in [place].  Allergies  Allergen Reactions  . Oxycodone     Current Outpatient Prescriptions  Medication Sig Dispense Refill  . albuterol (PROVENTIL HFA;VENTOLIN HFA) 108 (90 BASE) MCG/ACT inhaler Inhale 1 puff into the lungs every 6 (six) hours as needed.      Marland Kitchen albuterol (PROVENTIL) (2.5 MG/3ML) 0.083% nebulizer solution Take 2.5 mg by nebulization every 6 (six) hours as needed.      Marland Kitchen albuterol-ipratropium (COMBIVENT) 18-103 MCG/ACT inhaler Inhale 1 puff into the lungs every 6 (six) hours as needed.       Marland Kitchen arformoterol (BROVANA) 15 MCG/2ML NEBU Take 15 mcg by nebulization 2 (two) times daily.      Marland Kitchen aspirin 81 MG tablet Take 81 mg by mouth daily.      . Azilsartan Medoxomil 80 MG TABS Take 80 mg by mouth daily.      . bimatoprost (LUMIGAN) 0.03 % ophthalmic solution 1 drop at bedtime.      . brimonidine (ALPHAGAN) 0.15 % ophthalmic solution 1 drop 2 (two) times daily.       . budesonide (PULMICORT) 180 MCG/ACT inhaler Inhale 1 puff into the lungs 2 (two) times daily.      Marland Kitchen CALCIUM PO Take 500 mg by mouth daily.      . cetirizine (ZYRTEC) 10 MG tablet Take 10 mg by mouth daily.      . cholecalciferol (VITAMIN D)  1000 UNITS tablet Take 1,000 Units by mouth daily.      Marland Kitchen doxazosin (CARDURA) 2 MG tablet Take 2 mg by mouth at bedtime.      . DULERA 200-5 MCG/ACT AERO Inhale 2 puffs into the lungs 2 (two) times daily.       Marland Kitchen EPINEPHrine (EPIPEN JR) 0.15 MG/0.3ML injection Inject 0.15 mg into the muscle as needed.      Marland Kitchen esomeprazole (NEXIUM) 20 MG capsule Take 20 mg by mouth daily before breakfast.      . guaiFENesin (MUCINEX) 600 MG 12 hr tablet Take 1,200 mg by mouth as needed.       Marland Kitchen levothyroxine (SYNTHROID, LEVOTHROID) 125 MCG tablet Take 125 mcg by mouth daily.      Marland Kitchen pyridoxine (B-6) 250 MG tablet Take 250 mg by mouth daily.      . theophylline (THEO-24) 100 MG 24 hr capsule Take 100 mg by mouth daily.        Past Medical History  Diagnosis Date  . Hypertension   . Acid reflux   . Glaucoma   . Sleep apnea   . Prostatic hypertrophy   . GERD (gastroesophageal reflux disease)   . Chronic obstructive pulmonary disease   . Thyroid enlargement     Had a throidectomy    Past Surgical History  Procedure Date  . Foot surgery   . Knee surgery   .  Hernia repair   . Tonsillectomy   . Prostate surgery   . Thyroidectomy     History   Social History  . Marital Status: Married    Spouse Name: N/A    Number of Children: N/A  . Years of Education: N/A   Occupational History  . Not on file.   Social History Main Topics  . Smoking status: Former Smoker -- 0.3 packs/day for 60 years    Types: Cigarettes    Quit date: 07/09/2007  . Smokeless tobacco: Not on file  . Alcohol Use: No  . Drug Use: No  . Sexually Active: Not on file   Other Topics Concern  . Not on file   Social History Narrative  . No narrative on file    Family History  Problem Relation Age of Onset  . Heart disease Mother   . Alcohol abuse Father   . Cancer Father     Cause of death; he also had a colostomy bag    ROS: no nausea, vomiting; no fever, chills; no melena, hematochezia; no claudication  PHYSICAL  EXAM: BP 128/82  Pulse 91  Ht 6\' 2"  (1.88 m)  Wt 175 lb (79.379 kg)  BMI 22.47 kg/m2  SpO2 95% GENERAL: 78 year-old male, sitting in wheelchair; NAD  HEENT: NCAT, PERRLA, EOMI; sclera clear; no xanthelasma; nasal cannula  NECK: palpable bilateral carotid pulses, no bruits; no JVD; no TM  LUNGS: Faint basilar crackles, no wheezes  CARDIAC: RRR (S1, S2); no significant murmurs; no rubs or gallops  ABDOMEN: soft, non-tender; intact BS  EXTREMETIES: Trace peripheral edema  SKIN: warm/dry; no obvious rash/lesions  MUSCULOSKELETAL: no joint deformity  NEURO: no focal deficit; NL affect    EKG:    ASSESSMENT & PLAN:  Chronic diastolic heart failure Results of the cardiac catheterization were reviewed with the patient and his wife, and he was reassured that he has normal coronary arteries. Given the noted elevated wedge pressure, however, I recommended continuing current dose Lasix, indefinitely. Patient also was advised to weigh himself daily, if possible, and to refrain from added salt in his diet. We will check followup BMET/BNP level today and reassess his clinical status in 6 months, with Dr. Diona Browner, with whom he will establish.  Hypertension Stable on current medication regimen, followed by primary M.D.  Edema Much improved. Continue current diuretic regimen.  Renal insufficiency Will check followup labs    Gene Makailee Nudelman, PAC

## 2012-05-22 ENCOUNTER — Encounter: Payer: Self-pay | Admitting: *Deleted

## 2013-10-26 ENCOUNTER — Telehealth: Payer: Self-pay

## 2013-10-26 NOTE — Telephone Encounter (Signed)
Spoke with patient's wife to see if he was available to schedule appointment.  [facility]

## 2016-05-15 ENCOUNTER — Other Ambulatory Visit (HOSPITAL_COMMUNITY): Payer: Self-pay

## 2016-05-15 ENCOUNTER — Inpatient Hospital Stay
Admission: AD | Admit: 2016-05-15 | Discharge: 2016-06-07 | Disposition: E | Payer: Self-pay | Source: Ambulatory Visit | Attending: Internal Medicine | Admitting: Internal Medicine

## 2016-05-15 DIAGNOSIS — Z0189 Encounter for other specified special examinations: Secondary | ICD-10-CM

## 2016-05-15 DIAGNOSIS — J9690 Respiratory failure, unspecified, unspecified whether with hypoxia or hypercapnia: Secondary | ICD-10-CM

## 2016-05-15 LAB — BLOOD GAS, ARTERIAL
ACID-BASE EXCESS: 15.2 mmol/L — AB (ref 0.0–2.0)
Bicarbonate: 40.5 mmol/L — ABNORMAL HIGH (ref 20.0–28.0)
DRAWN BY: 301361
FIO2: 50
LHR: 18 {breaths}/min
MECHVT: 400 mL
O2 SAT: 98 %
PATIENT TEMPERATURE: 98.6
PEEP/CPAP: 5 cmH2O
PH ART: 7.428 (ref 7.350–7.450)
PO2 ART: 114 mmHg — AB (ref 83.0–108.0)
pCO2 arterial: 62.4 mmHg — ABNORMAL HIGH (ref 32.0–48.0)

## 2016-05-16 MED FILL — Medication: Qty: 1 | Status: AC

## 2016-06-07 NOTE — Code Documentation (Signed)
CODE BLUE NOTE  Patient Name: Trevor Burns   MRN: [id number]   Date of Birth/ Sex: 01/17/1932 , male      Admission Date: 05/18/2016  Attending Provider: Carron CurieAli Hijazi, MD  Primary Diagnosis: acute hypoxic hypercania    Indication: Pt was in his usual state of health until this AM, when he was noted to be in asystole. Code blue was subsequently called. At the time of arrival on scene, ACLS protocol was underway.   Technical Description:  - CPR performance duration:  29 minutes  - Was defibrillation or cardioversion used? Yes   - Was external pacer placed? No  - Was patient intubated pre/post CPR? Yes    Medications Administered: Y = Yes; Blank = No Amiodarone    Atropine    Calcium  y  Epinephrine  y  Lidocaine    Magnesium    Norepinephrine    Phenylephrine    Sodium bicarbonate  y  Vasopressin    Other     Post CPR evaluation:  - Final Status - Was patient successfully resuscitated ? No   Miscellaneous Information:  - Time of death:  12.55  AM  - Primary team notified?  Yes  - Family Notified? Yes       Freddrick MarchYashika Doria Fern, MD   05/26/2016, 1:00 AM

## 2016-06-07 NOTE — Progress Notes (Signed)
[facility] Dr. Nedra HaiLee for patient in cardiac arrest. My limited knowledge of the patient was obtained at bedside from staff during code. [facility] [place] where he reportedly suffered multiple cardiac arrests and [facility]. No chest tube in place. Upon my arrival code was in progress after he reportedly bradied down and went into asystole. By the time I got there he had EXTENSIVE subcutaneous air from his neck to his genitals. Extensive. Staff reported PTX was on R. I performed needle decompression over the suspected area of 2nd ICS at the CL. Unable to feel landmarks due to subQ air. Code continued for about 5 more minutes with persistent asystole. At which point [facility] code and TOD 0055. Had been in cardiac arrest for 30+ minutes.  Joneen RoachPaul Hoffman, AGACNP-BC [facility] Pager [phone] or [phone]) [phone]  05/18/2016 1:06 AM

## 2016-06-07 DEATH — deceased

## 2017-11-08 IMAGING — CR DG ABD PORTABLE 1V
2 series · 2 of 2 positions shown · non-contrast
Comparison: 05/25/2015, chest x-ray 04/06/2016

CLINICAL DATA: OG tube placement

EXAM:
PORTABLE ABDOMEN - 1 VIEW

[AP (1 of 2)]
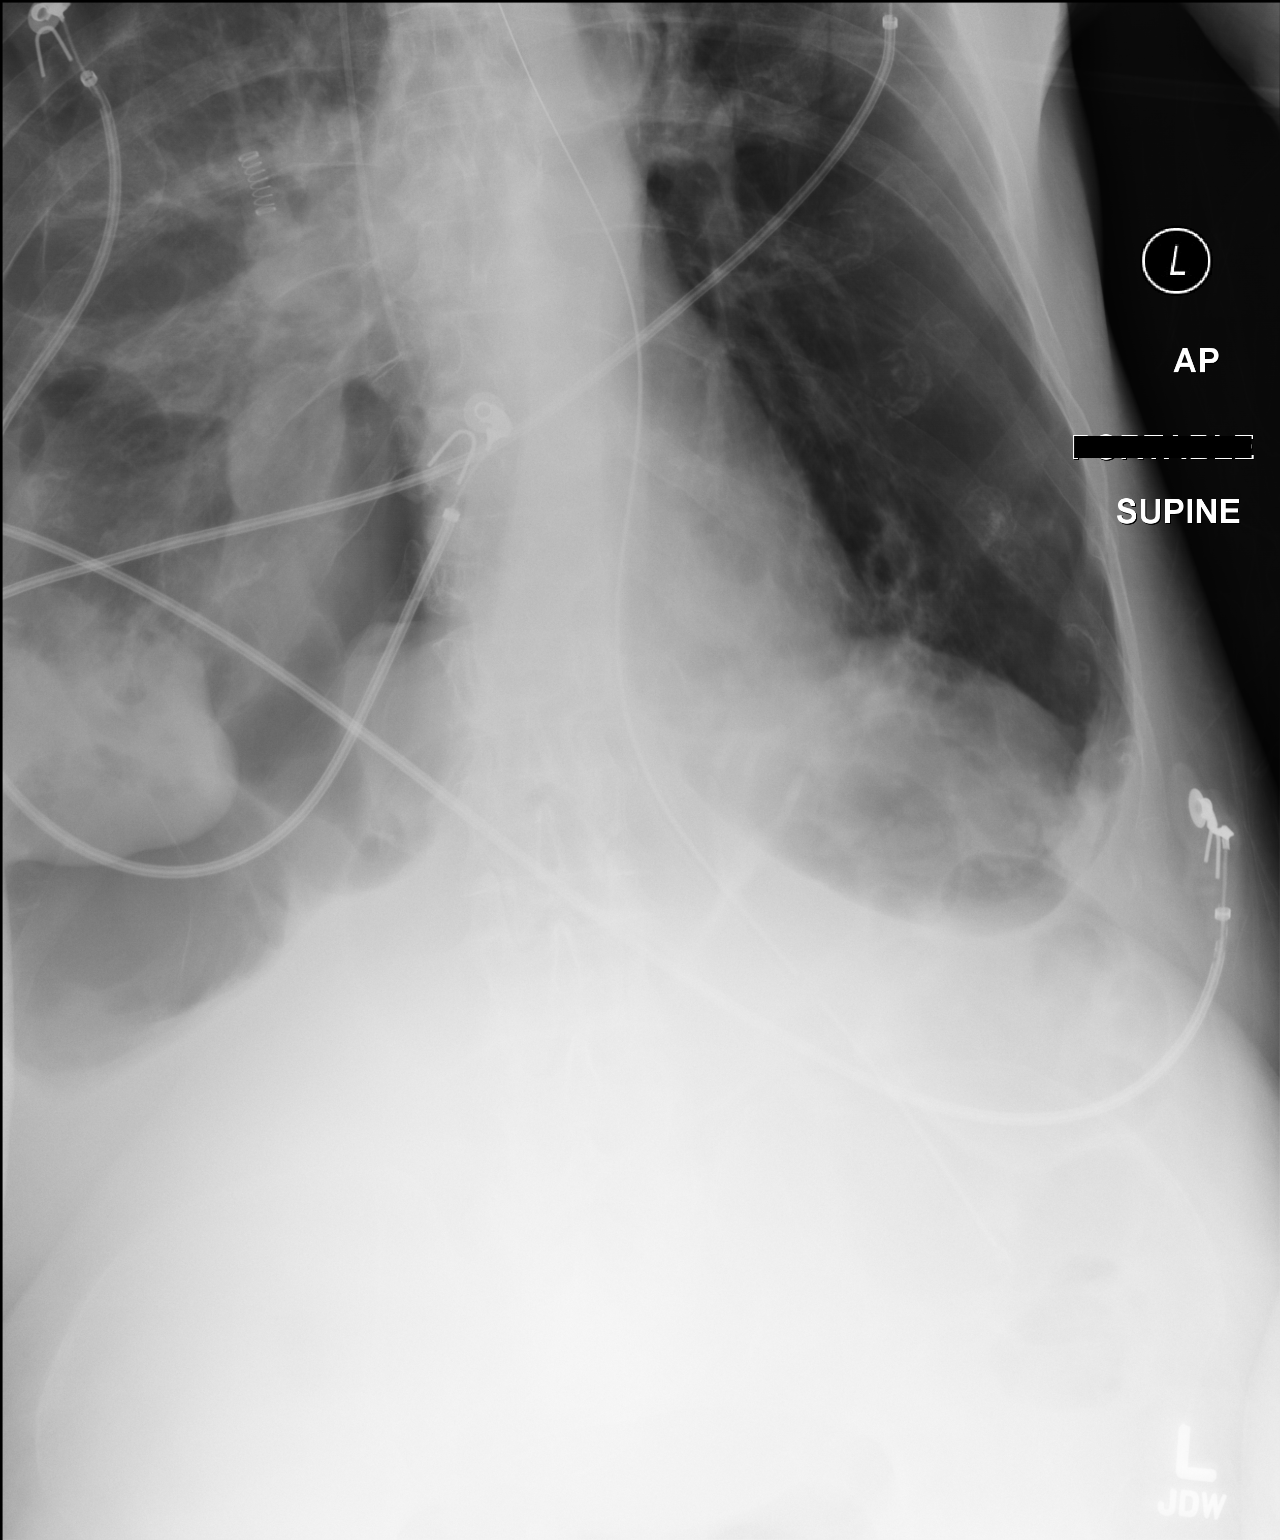

[AP (2 of 2)]
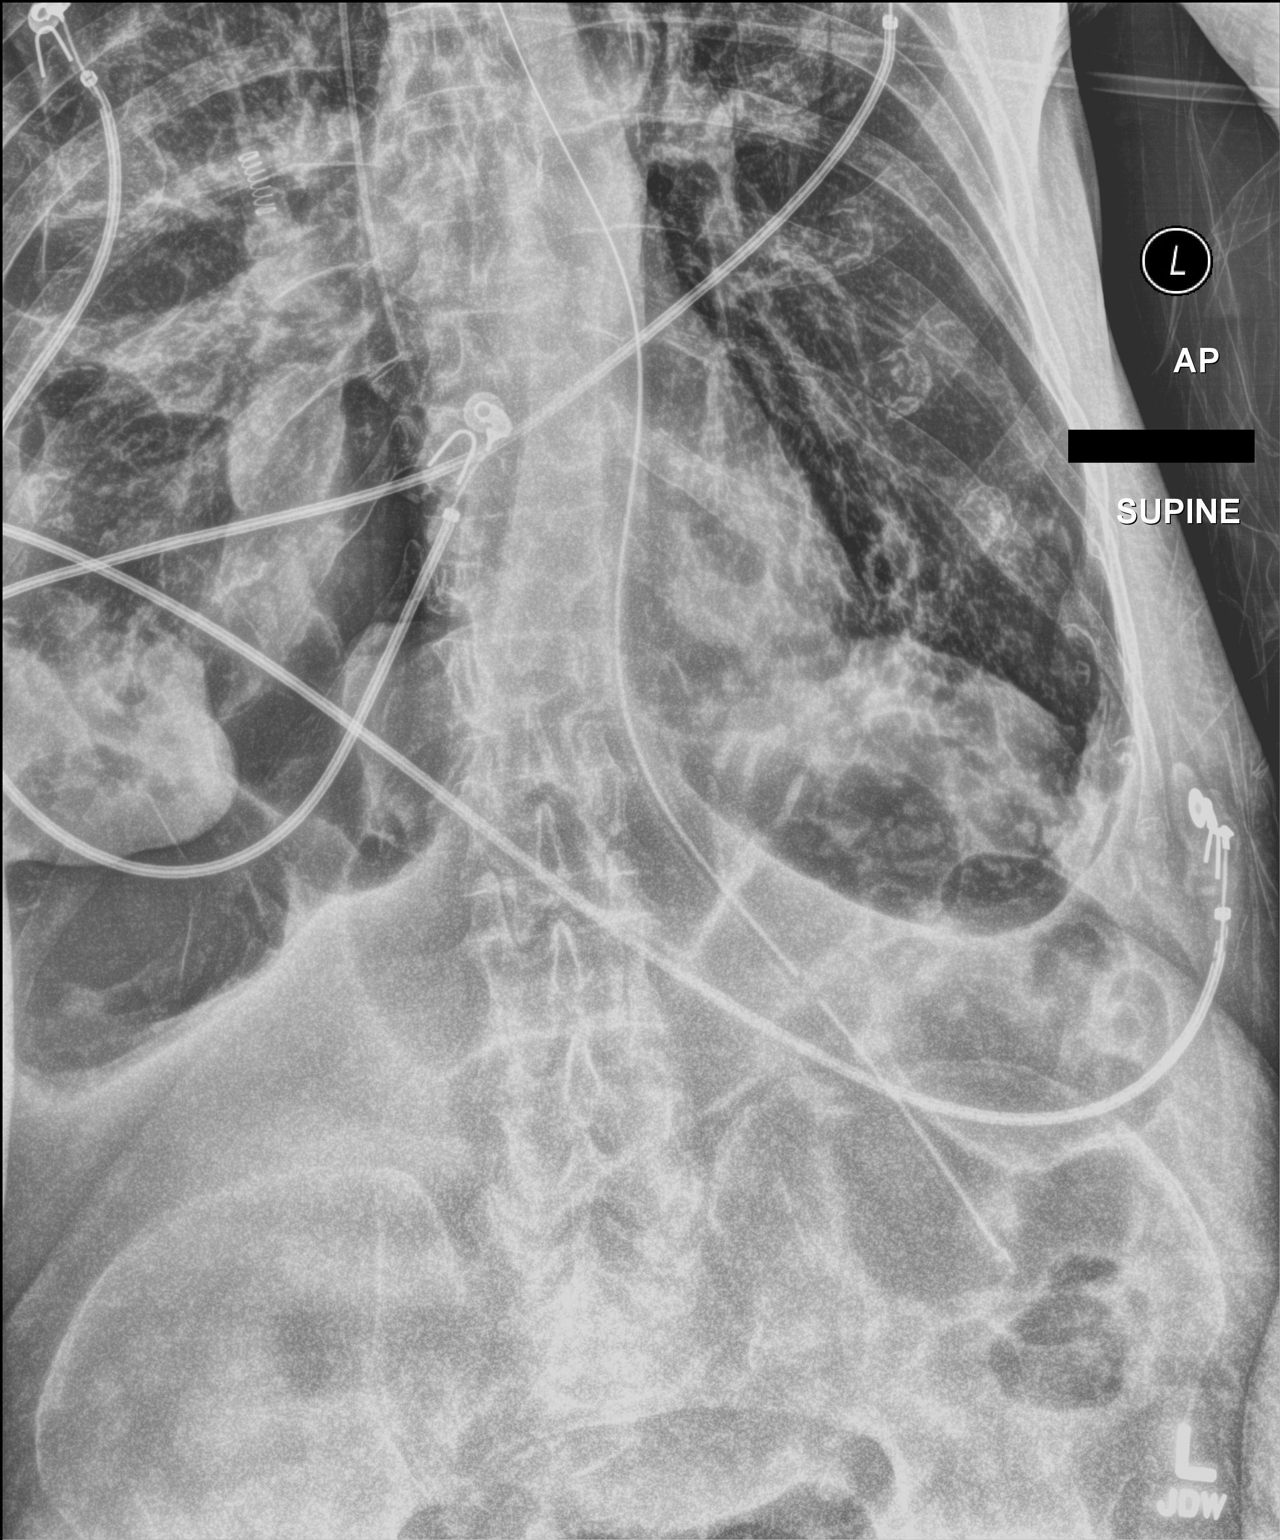

[2 of 2 positions shown; findings below may reference images not displayed]

FINDINGS: Esophageal tube tip is in the left hemi abdomen, presumably over the
proximal stomach.

There are hyper lucencies at old lung bases, incompletely
visualized. There is a lobulated opacity at the right lung base.
There is a partially visualized venous catheter overlying the SVC.
IMPRESSION: Esophageal tube tip projects over the left upper abdomen, presumably
over the stomach

Hyper lucencies (? Pneumothorax) at the bilateral lung bases with
lobulated opacity, partially visualized at the right lung base.
Recommend dedicated two view chest for further evaluation.

These results will be called to the ordering clinician or
representative by the Radiologist Assistant, and communication
documented in the PACS or zVision Dashboard.

## 2017-11-08 IMAGING — CR DG CHEST 1V PORT
2 series · 2 of 2 positions shown · non-contrast
Comparison: Chest radiograph 04/06/2016

CLINICAL DATA: Respiratory distress.

EXAM:
PORTABLE CHEST 1 VIEW

[AP (1 of 2)]
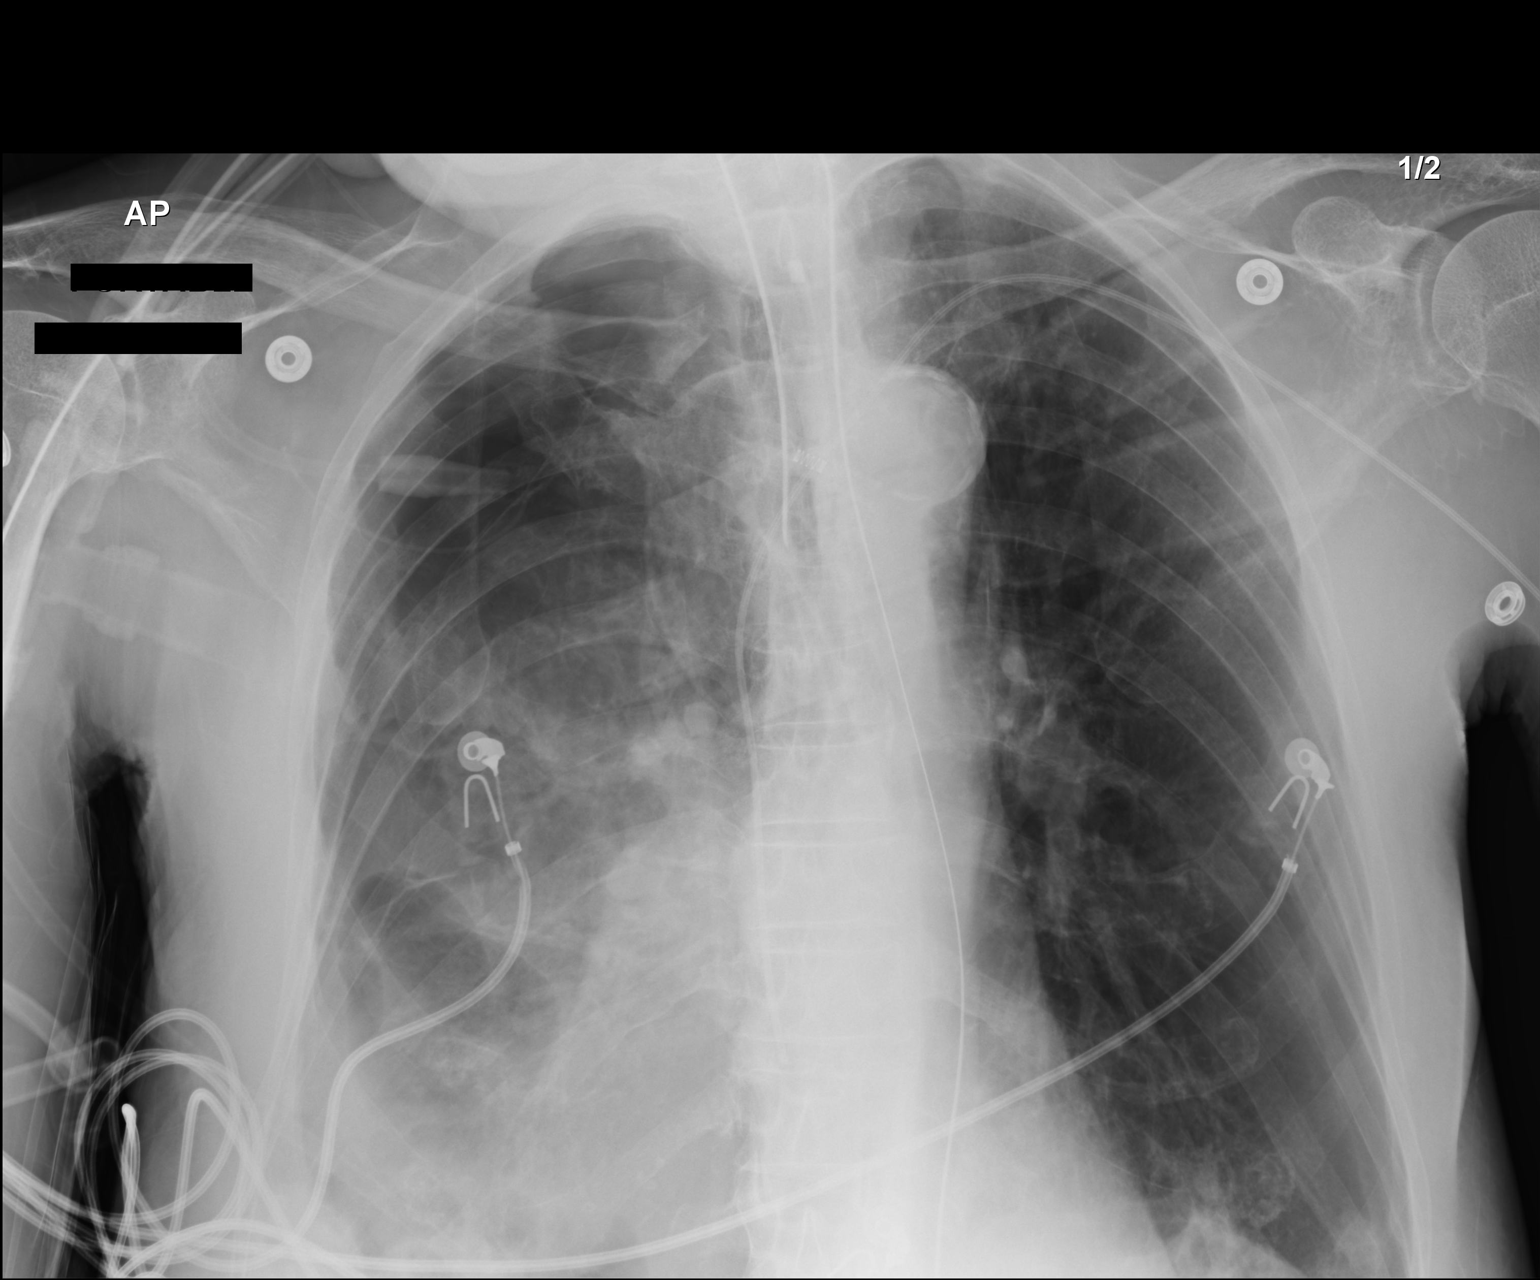

[AP (2 of 2)]
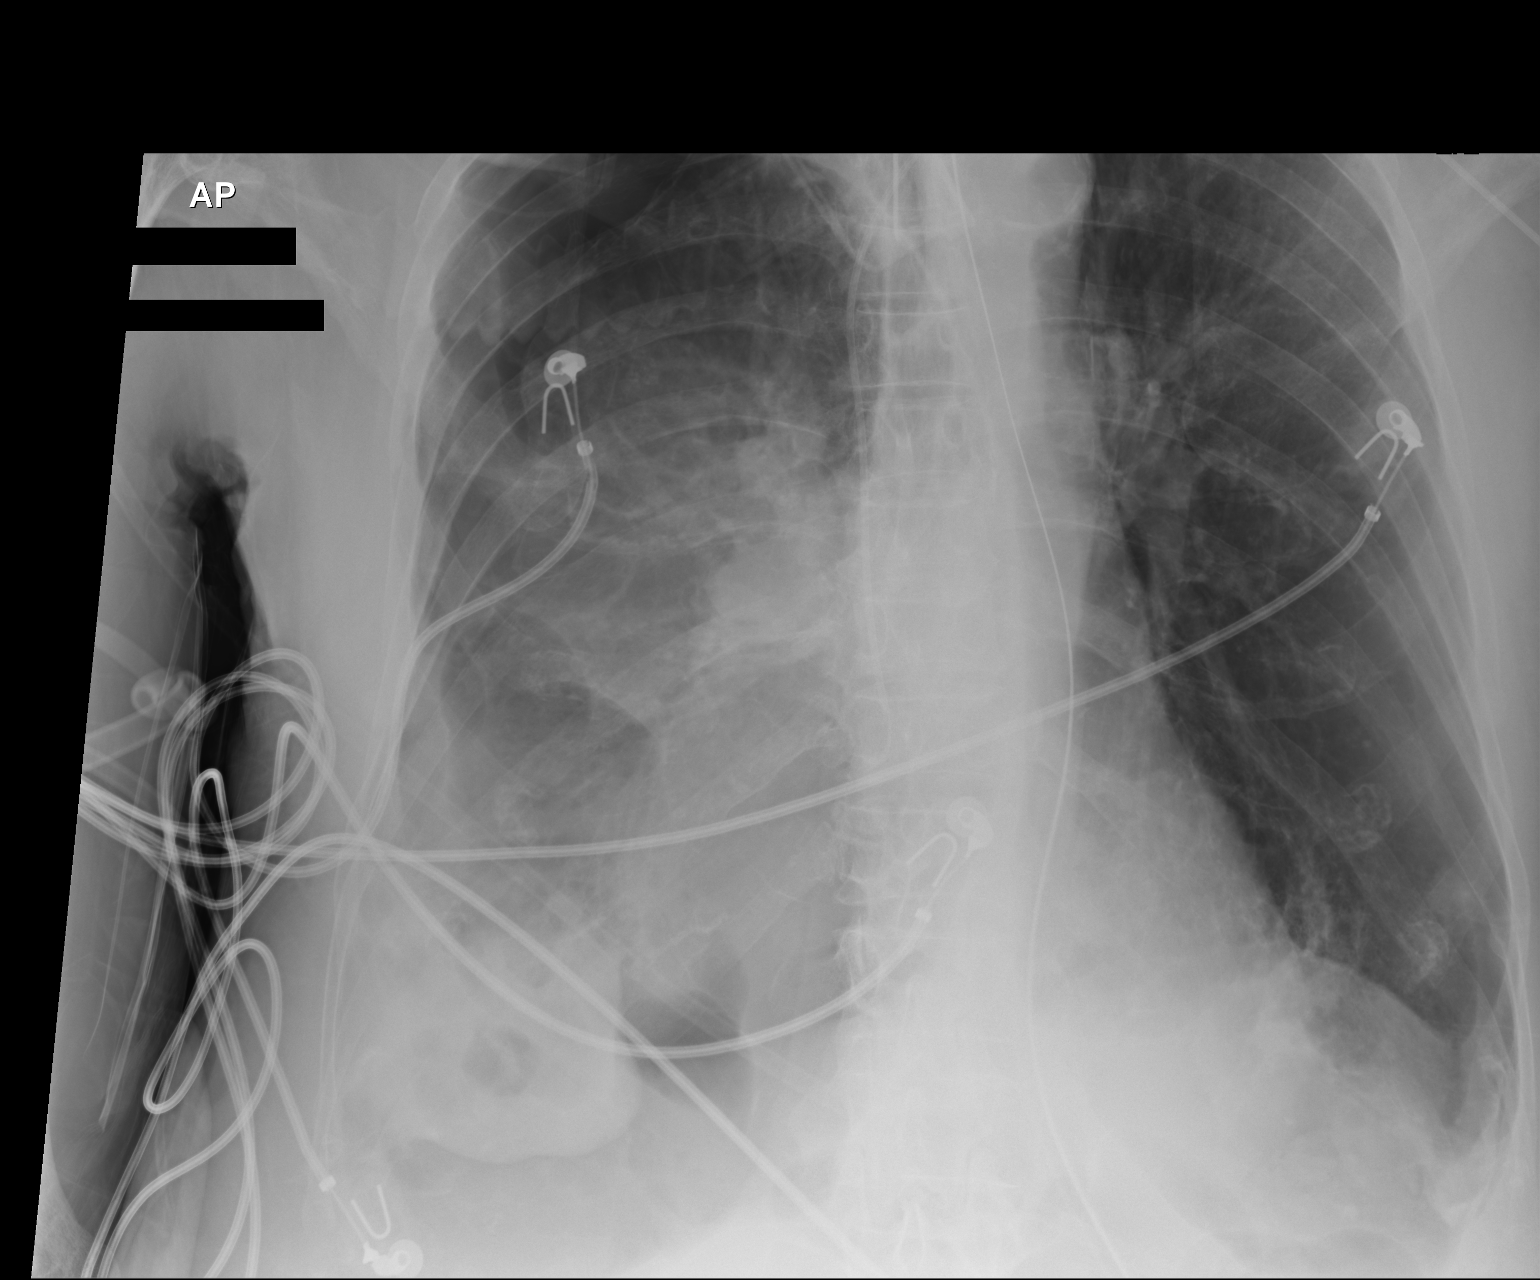

[2 of 2 positions shown; findings below may reference images not displayed]

FINDINGS: Endotracheal tube tip is below the level of the clavicles,
approximately 2 cm above the inferior margin of the carina. Left
PICC line tip is at the cavoatrial junction. Nasogastric tube
extends beyond the inferior field of view.

The lungs are hyperexpanded. There is aortic arch atherosclerotic
calcification. There is lucency along the lateral margin of the
right lung, consistent with large pneumothorax. There are
parenchymal opacities in the parahilar right lung and at the right
lung base. There is no left pneumothorax. There is consolidation
also present left lung base.
IMPRESSION: 1. Large right pneumothorax.
2. Extensive right basilar and right parahilar opacities, as well as
left basilar consolidation. This may indicate pneumonia and/or
atelectasis.
3. Recommend retraction of endotracheal tube by approximately 3 cm.
4. Aortic atherosclerosis.
These results will be called to the ordering clinician or
representative by the Radiologist Assistant, and communication
documented in the PACS or zVision Dashboard.
